# Patient Record
Sex: Male | Born: 1995 | Race: Black or African American | Hispanic: No | Marital: Single | Smoking: Never smoker
Health system: Southern US, Community
[De-identification: ages and names within clinical notes are randomized; demographics above are authoritative.]

---
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
## 2017-10-02 ENCOUNTER — Encounter (HOSPITAL_COMMUNITY): Payer: Self-pay | Admitting: Emergency Medicine

## 2017-10-02 ENCOUNTER — Other Ambulatory Visit: Payer: Self-pay

## 2017-10-02 ENCOUNTER — Emergency Department (HOSPITAL_COMMUNITY)
Admission: EM | Admit: 2017-10-02 | Discharge: 2017-10-02 | Disposition: A | Discharge status: Home / Self Care | Payer: 59 | Attending: Emergency Medicine | Admitting: Emergency Medicine

## 2017-10-02 DIAGNOSIS — K2920 Alcoholic gastritis without bleeding: Secondary | ICD-10-CM | POA: Insufficient documentation

## 2017-10-02 DIAGNOSIS — R112 Nausea with vomiting, unspecified: Secondary | ICD-10-CM

## 2017-10-02 LAB — COMPREHENSIVE METABOLIC PANEL
ALBUMIN: 4.6 g/dL (ref 3.5–5.0)
ALK PHOS: 75 U/L (ref 38–126)
ALT: 26 U/L (ref 0–44)
AST: 37 U/L (ref 15–41)
Anion gap: 13 (ref 5–15)
BUN: 15 mg/dL (ref 6–20)
CHLORIDE: 104 mmol/L (ref 98–111)
CO2: 22 mmol/L (ref 22–32)
Calcium: 9.6 mg/dL (ref 8.9–10.3)
Creatinine, Ser: 1.12 mg/dL (ref 0.61–1.24)
GFR calc Af Amer: >60 mL/min (ref >60)
GFR calc non Af Amer: >60 mL/min (ref >60)
GLUCOSE: 85 mg/dL (ref 70–99)
POTASSIUM: 4.9 mmol/L (ref 3.5–5.1)
SODIUM: 139 mmol/L (ref 135–145)
Total Bilirubin: 0.9 mg/dL (ref 0.3–1.2)
Total Protein: 7.2 g/dL (ref 6.5–8.1)

## 2017-10-02 LAB — CBC
HEMATOCRIT: 41.6 % (ref 39.0–52.0)
Hemoglobin: 13.2 g/dL (ref 13.0–17.0)
MCH: 23.3 pg — AB (ref 26.0–34.0)
MCHC: 31.7 g/dL (ref 30.0–36.0)
MCV: 73.4 fL — AB (ref 78.0–100.0)
Platelets: 174 10*3/uL (ref 150–400)
RBC: 5.67 MIL/uL (ref 4.22–5.81)
RDW: 17.2 % — ABNORMAL HIGH (ref 11.5–15.5)
WBC: 7.6 10*3/uL (ref 4.0–10.5)

## 2017-10-02 LAB — URINALYSIS, ROUTINE W REFLEX MICROSCOPIC
Bilirubin Urine: NEGATIVE
Glucose, UA: NEGATIVE mg/dL
KETONES UR: 20 mg/dL — AB
Nitrite: NEGATIVE
Protein, ur: NEGATIVE mg/dL
Specific Gravity, Urine: 1.027 (ref 1.005–1.030)
pH: 5 (ref 5.0–8.0)

## 2017-10-02 LAB — LIPASE, BLOOD: Lipase: 39 U/L (ref 11–51)

## 2017-10-02 MED ORDER — ONDANSETRON HCL 4 MG PO TABS: 4.0000 mg | ORAL_TABLET | Freq: Four times a day (QID) | ORAL | 0 refills | Status: AC

## 2017-10-02 MED ORDER — PROMETHAZINE HCL 25 MG/ML IJ SOLN
25.0000 mg | Freq: Once | INTRAMUSCULAR | Status: AC
  Administered 2017-10-02: 25 mg via INTRAMUSCULAR
  Filled 2017-10-02: qty 1

## 2017-10-02 MED ORDER — FAMOTIDINE IN NACL 20-0.9 MG/50ML-% IV SOLN
20.0000 mg | Freq: Once | INTRAVENOUS | Status: AC
  Administered 2017-10-02: 20 mg via INTRAVENOUS
  Filled 2017-10-02: qty 50

## 2017-10-02 MED ORDER — ONDANSETRON 4 MG PO TBDP
4.0000 mg | ORAL_TABLET | Freq: Once | ORAL | Status: AC | PRN
  Administered 2017-10-02: 4 mg via ORAL
  Filled 2017-10-02: qty 1

## 2017-10-02 MED ORDER — FAMOTIDINE 20 MG PO TABS: 20.0000 mg | ORAL_TABLET | Freq: Two times a day (BID) | ORAL | 0 refills | Status: AC

## 2017-10-02 MED ORDER — SODIUM CHLORIDE 0.9 % IV BOLUS
1000.0000 mL | Freq: Once | INTRAVENOUS | Status: AC
  Administered 2017-10-02: 1000 mL via INTRAVENOUS

## 2017-10-02 MED ORDER — ONDANSETRON HCL 4 MG/2ML IJ SOLN
4.0000 mg | Freq: Once | INTRAMUSCULAR | Status: DC | PRN
  Filled 2017-10-02: qty 2

## 2017-10-02 NOTE — ED Provider Notes (Signed)
[facility] EMERGENCY DEPARTMENT Provider Note   CSN: [id number] Arrival date & time: 10/02/17  1452     History   Chief Complaint Chief Complaint  Patient presents with  . Emesis    HPI Dillon Jimenez is a 22 y.o. male.  He has no significant medical history.  He is complaining of nausea and vomiting unable to keep anything down after drinking alcohol heavily last night.  He denies any other drugs and has had no recent illnesses.  He is tried to drink water but just vomited back up.  There is been no vomiting of any blood and no significant abdominal pain or diarrhea.  He denies other complaints.  The history is provided by the patient.  Emesis   This is a new problem. The current episode started 6 to 12 hours ago. The problem occurs more than 10 times per day. The problem has not changed since onset.The emesis has an appearance of stomach contents. There has been no fever. Pertinent negatives include no abdominal pain, no chills, no cough, no diarrhea, no fever and no URI.    History reviewed. No pertinent past medical history.  There are no active problems to display for this patient.   History reviewed. No pertinent surgical history.      Home Medications    Prior to Admission medications   Medication Sig Start Date End Date Taking? Authorizing Provider  hydrOXYzine (ATARAX/VISTARIL) 25 MG tablet Take 1 tablet (25 mg total) by mouth every 6 (six) hours. Prn itching 11/12/12   Linna Hoff, MD  permethrin (ELIMITE) 5 % cream Use as directed tomight , wash off in am, repeat in 1 week. 11/12/12   Linna Hoff, MD    Family History No family history on file.  Social History Social History   Tobacco Use  . Smoking status: Never Smoker  . Smokeless tobacco: Never Used  Substance Use Topics  . Alcohol use: Yes    Comment: socially  . Drug use: No     Allergies   Patient has no known allergies.   Review of Systems Review of Systems    Constitutional: Negative for chills and fever.  HENT: Negative for sore throat.   Eyes: Negative for visual disturbance.  Respiratory: Negative for cough and shortness of breath.   Cardiovascular: Negative for chest pain.  Gastrointestinal: Positive for vomiting. Negative for abdominal pain and diarrhea.  Genitourinary: Negative for dysuria and hematuria.  Musculoskeletal: Negative for back pain.  Skin: Negative for rash.  Neurological: Negative for seizures.     Physical Exam Updated Vital Signs BP 125/72 (BP Location: Right Arm)   Pulse 67   Temp 97.9 F (36.6 C) (Oral)   Resp 16   Ht 5\' 9"  (1.753 m)   Wt 65.8 kg (145 lb)   SpO2 100%   BMI 21.41 kg/m   Physical Exam  Constitutional: He appears well-developed and well-nourished.  HENT:  Head: Normocephalic and atraumatic.  Right Ear: External ear normal.  Left Ear: External ear normal.  Nose: Nose normal.  Mouth/Throat: Oropharynx is clear and moist.  Eyes: Pupils are equal, round, and reactive to light. Conjunctivae and EOM are normal.  Neck: Normal range of motion. Neck supple.  Cardiovascular: Normal rate, regular rhythm, normal heart sounds and intact distal pulses.  No murmur heard. Pulmonary/Chest: Effort normal and breath sounds normal. No respiratory distress.  Abdominal: Soft. He exhibits no mass. There is no tenderness. There is no guarding.  Musculoskeletal: Normal range of motion. He exhibits no edema.  Neurological: He is alert.  Skin: Skin is warm and dry.  Psychiatric: He has a normal mood and affect.  Nursing note and vitals reviewed.    ED Treatments / Results  Labs (all labs ordered are listed, but only abnormal results are displayed) Labs Reviewed  CBC - Abnormal; Notable for the following components:      Result Value   MCV 73.4 (*)    MCH 23.3 (*)    RDW 17.2 (*)    All other components within normal limits  URINALYSIS, ROUTINE W REFLEX MICROSCOPIC - Abnormal; Notable for the following  components:   Hgb urine dipstick SMALL (*)    Ketones, ur 20 (*)    Leukocytes, UA TRACE (*)    Bacteria, UA FEW (*)    All other components within normal limits  LIPASE, BLOOD  COMPREHENSIVE METABOLIC PANEL  ETHANOL    EKG None  Radiology No results found.  Procedures Procedures (including critical care time)  Medications Ordered in ED Medications  ondansetron (ZOFRAN) injection 4 mg (has no administration in time range)  sodium chloride 0.9 % bolus 1,000 mL (has no administration in time range)  famotidine (PEPCID) IVPB 20 mg premix (has no administration in time range)  ondansetron (ZOFRAN-ODT) disintegrating tablet 4 mg (4 mg Oral Given 10/02/17 1506)  promethazine (PHENERGAN) injection 25 mg (25 mg Intramuscular Given 10/02/17 1520)     Initial Impression / Assessment and Plan / ED Course  I have reviewed the triage vital signs and the nursing notes.  Pertinent labs & imaging results that were available during my care of the patient were reviewed by me and considered in my medical decision making (see chart for details).      Final Clinical Impressions(s) / ED Diagnoses   Final diagnoses:  Non-intractable vomiting with nausea, unspecified vomiting type  Acute alcoholic gastritis, presence of bleeding unspecified    ED Discharge Orders        Ordered    famotidine (PEPCID) 20 MG tablet  2 times daily     10/02/17 1844    ondansetron (ZOFRAN) 4 MG tablet  Every 6 hours     10/02/17 1844       Terrilee FilesButler, Chamaine Stankus C, MD 10/03/17 [phone]

## 2017-10-02 NOTE — ED Notes (Signed)
Pt given [place] sandwich, PB and crackers per MD

## 2017-10-02 NOTE — ED Triage Notes (Signed)
Patient to ED c/o N/V since this morning - states he drank a lot yesterday and didn't have anything on his stomach. Patient actively vomiting in triage. Reports he can't keep anything down, including water. Denies pain.

## 2017-10-02 NOTE — ED Notes (Signed)
Pt says he woke up with scratches on his neck, does not know where they came from; c/o of burning sensation; scratches noted to R neck and shoulder

## 2017-10-02 NOTE — ED Notes (Signed)
Pt tolerating fluids well. 

## 2017-10-02 NOTE — ED Provider Notes (Signed)
Patient placed in Quick Look pathway, seen and evaluated   Chief Complaint: emesis  HPI:   Dillon Jimenez is a. 22 y.o. male who presents to the ED with n/v that started this morning. Patient states he drank a lot yesterday and didn't have anything on his stomach. Patient actively vomiting in triage. Reports he can't keep anything down, including water. Denies pain  ROS: GI; n/v  Physical Exam:  BP 125/72 (BP Location: Right Arm)   Pulse 67   Temp 97.9 F (36.6 C) (Oral)   Resp 16   Ht 5\' 9"  (1.753 m)   Wt 65.8 kg (145 lb)   SpO2 100%   BMI 21.41 kg/m    Gen: No distress  Neuro: Awake and Alert  Skin: Warm and dry  GI: actively vomiting in the exam room    Initiation of care has begun. The patient has been counseled on the process, plan, and necessity for staying for the completion/evaluation, and the remainder of the medical screening examination    Janne Napoleoneese, Hope M, NP 10/02/17 1512    Blane OharaZavitz, Joshua, MD 10/03/17 [phone]

## 2017-10-02 NOTE — Discharge Instructions (Addendum)
Your evaluated in the emergency department for nausea vomiting and upper abdominal pain in the setting of having drank alcohol last night.  You improved after fluids and we are sending her home with some acid medicine and nausea medicine.  Please return if any concerns.

## 2019-06-21 ENCOUNTER — Encounter (HOSPITAL_COMMUNITY): Payer: Self-pay | Admitting: Radiology

## 2021-01-12 ENCOUNTER — Emergency Department (HOSPITAL_COMMUNITY)
Admission: EM | Admit: 2021-01-12 | Discharge: 2021-01-13 | Disposition: A | Discharge status: Home / Self Care | Payer: Self-pay | Attending: Emergency Medicine | Admitting: Emergency Medicine

## 2021-01-12 ENCOUNTER — Other Ambulatory Visit: Payer: Self-pay

## 2021-01-12 ENCOUNTER — Encounter (HOSPITAL_COMMUNITY): Payer: Self-pay | Admitting: Emergency Medicine

## 2021-01-12 DIAGNOSIS — R112 Nausea with vomiting, unspecified: Secondary | ICD-10-CM | POA: Insufficient documentation

## 2021-01-12 DIAGNOSIS — R1013 Epigastric pain: Secondary | ICD-10-CM | POA: Insufficient documentation

## 2021-01-12 LAB — URINALYSIS, ROUTINE W REFLEX MICROSCOPIC
Bacteria, UA: NONE SEEN
Bilirubin Urine: NEGATIVE
Glucose, UA: NEGATIVE mg/dL
Hgb urine dipstick: NEGATIVE
Ketones, ur: 80 mg/dL — AB
Leukocytes,Ua: NEGATIVE
Nitrite: NEGATIVE
Protein, ur: 30 mg/dL — AB
Specific Gravity, Urine: 1.028 (ref 1.005–1.030)
pH: 5 (ref 5.0–8.0)

## 2021-01-12 LAB — COMPREHENSIVE METABOLIC PANEL
ALT: 40 U/L (ref 0–44)
AST: 53 U/L — ABNORMAL HIGH (ref 15–41)
Albumin: 4.7 g/dL (ref 3.5–5.0)
Alkaline Phosphatase: 78 U/L (ref 38–126)
Anion gap: 14 (ref 5–15)
BUN: 17 mg/dL (ref 6–20)
CO2: 21 mmol/L — ABNORMAL LOW (ref 22–32)
Calcium: 9.2 mg/dL (ref 8.9–10.3)
Chloride: 101 mmol/L (ref 98–111)
Creatinine, Ser: 0.98 mg/dL (ref 0.61–1.24)
GFR, Estimated: >60 mL/min (ref >60)
Glucose, Bld: 79 mg/dL (ref 70–99)
Potassium: 4.5 mmol/L (ref 3.5–5.1)
Sodium: 136 mmol/L (ref 135–145)
Total Bilirubin: 0.6 mg/dL (ref 0.3–1.2)
Total Protein: 7.8 g/dL (ref 6.5–8.1)

## 2021-01-12 LAB — CBC WITH DIFFERENTIAL/PLATELET
Abs Immature Granulocytes: 0.02 10*3/uL (ref 0.00–0.07)
Basophils Absolute: 0 10*3/uL (ref 0.0–0.1)
Basophils Relative: 0 %
Eosinophils Absolute: 0 10*3/uL (ref 0.0–0.5)
Eosinophils Relative: 0 %
HCT: 43.3 % (ref 39.0–52.0)
Hemoglobin: 13.8 g/dL (ref 13.0–17.0)
Immature Granulocytes: 0 %
Lymphocytes Relative: 13 %
Lymphs Abs: 0.8 10*3/uL (ref 0.7–4.0)
MCH: 24.1 pg — ABNORMAL LOW (ref 26.0–34.0)
MCHC: 31.9 g/dL (ref 30.0–36.0)
MCV: 75.6 fL — ABNORMAL LOW (ref 80.0–100.0)
Monocytes Absolute: 0.2 10*3/uL (ref 0.1–1.0)
Monocytes Relative: 4 %
Neutro Abs: 5.5 10*3/uL (ref 1.7–7.7)
Neutrophils Relative %: 83 %
Platelets: 185 10*3/uL (ref 150–400)
RBC: 5.73 MIL/uL (ref 4.22–5.81)
RDW: 16.7 % — ABNORMAL HIGH (ref 11.5–15.5)
WBC: 6.6 10*3/uL (ref 4.0–10.5)
nRBC: 0 % (ref 0.0–0.2)

## 2021-01-12 LAB — LIPASE, BLOOD: Lipase: 35 U/L (ref 11–51)

## 2021-01-12 MED ORDER — ONDANSETRON 4 MG PO TBDP
8.0000 mg | ORAL_TABLET | Freq: Once | ORAL | Status: AC
  Administered 2021-01-13: 8 mg via ORAL
  Filled 2021-01-12: qty 2

## 2021-01-12 NOTE — ED Triage Notes (Signed)
Pt reports vomiting since this AM.  RLQ pain started 10 min ago.  Also reports "a little" diarrhea.

## 2021-01-12 NOTE — ED Provider Notes (Signed)
Emergency Medicine Provider Triage Evaluation Note  Dillon Jimenez , a 25 y.o. male  was evaluated in triage.  Pt complains of nausea, vomiting, abdominal pain.  Started acutely this morning, has not tried any alleviating factors.  No history of prior abdominal surgeries, no urinary symptoms.  Nobody around him is sick, patient smokes marijuana every other day..  Review of Systems  Positive: Nausea, vomiting, abdominal pain Negative: Fevers  Physical Exam  BP 120/62 (BP Location: Right Arm)   Pulse 73   Temp 97.8 F (36.6 C) (Oral)   Resp 16   SpO2 100%  Gen:   Awake, no distress   Resp:  Normal effort  MSK:   Moves extremities without difficulty  Other:  Right lower quadrant abdominal tenderness  Medical Decision Making  Medically screening exam initiated at 3:19 PM.  Appropriate orders placed.  Dillon Jimenez was informed that the remainder of the evaluation will be completed by another provider, this initial triage assessment does not replace that evaluation, and the importance of remaining in the ED until their evaluation is complete.  Abdominal pain, will start with labs and treat with nausea medicine   Theron Arista, PA-C 01/12/21 1520    Benjiman Core, MD 01/12/21 1652

## 2021-01-13 MED ORDER — ONDANSETRON 4 MG PO TBDP: 4.0000 mg | ORAL_TABLET | Freq: Three times a day (TID) | ORAL | 0 refills | Status: AC | PRN

## 2021-01-13 NOTE — ED Provider Notes (Signed)
[facility] EMERGENCY DEPARTMENT Provider Note  CSN: [id number] Arrival date & time: 01/12/21 1417  Chief Complaint(s) Abdominal Pain and Vomiting  HPI Dillon Jimenez is a 25 y.o. male here for 1 day of upper abdominal pain with associated nausea and nonbloody nonbilious emesis.  He reports drinking heavily last night.  States that he drank lots of tequila.  Reports that he has been unable to keep much down today.  Epigastric abdominal pain worse with emesis.  Currently denies any abdominal pain at this time.  He denies any known sick contacts.  No fevers or chills.  No coughing or congestion.  HPI  Past Medical History History reviewed. No pertinent past medical history. There are no problems to display for this patient.  Home Medication(s) Prior to Admission medications   Medication Sig Start Date End Date Taking? Authorizing Provider  ondansetron (ZOFRAN ODT) 4 MG disintegrating tablet Take 1 tablet (4 mg total) by mouth every 8 (eight) hours as needed for up to 3 days for nausea or vomiting. 01/13/21 01/16/21 Yes Malvika Tung, Amadeo Garnet, MD  famotidine (PEPCID) 20 MG tablet Take 1 tablet (20 mg total) by mouth 2 (two) times daily. 10/02/17   Terrilee Files, MD  hydrOXYzine (ATARAX/VISTARIL) 25 MG tablet Take 1 tablet (25 mg total) by mouth every 6 (six) hours. Prn itching Patient not taking: Reported on 10/02/2017 11/12/12   Linna Hoff, MD  ondansetron (ZOFRAN) 4 MG tablet Take 1 tablet (4 mg total) by mouth every 6 (six) hours. 10/02/17   Terrilee Files, MD  permethrin (ELIMITE) 5 % cream Use as directed tomight , wash off in am, repeat in 1 week. Patient not taking: Reported on 10/02/2017 11/12/12   Linna Hoff, MD                                                                                                                                    Past Surgical History History reviewed. No pertinent surgical history. Family History No family history on  file.  Social History Social History   Tobacco Use   Smoking status: Never   Smokeless tobacco: Never  Substance Use Topics   Alcohol use: Yes    Comment: socially   Drug use: No   Allergies Patient has no known allergies.  Review of Systems Review of Systems All other systems are reviewed and are negative for acute change except as noted in the HPI  Physical Exam Vital Signs  I have reviewed the triage vital signs BP 108/68 (BP Location: Right Arm)   Pulse 83   Temp 97.7 F (36.5 C) (Oral)   Resp 16   Ht 5\' 9"  (1.753 m)   Wt 64.4 kg   SpO2 100%   BMI 20.97 kg/m   Physical Exam Vitals reviewed.  Constitutional:      General: He is not in acute distress.    Appearance: He  is well-developed. He is not diaphoretic.  HENT:     Head: Normocephalic and atraumatic.     Right Ear: External ear normal.     Left Ear: External ear normal.     Nose: Nose normal.     Mouth/Throat:     Mouth: Mucous membranes are moist.  Eyes:     General: No scleral icterus.    Conjunctiva/sclera: Conjunctivae normal.  Neck:     Trachea: Phonation normal.  Cardiovascular:     Rate and Rhythm: Normal rate and regular rhythm.  Pulmonary:     Effort: Pulmonary effort is normal. No respiratory distress.     Breath sounds: No stridor.  Abdominal:     General: There is no distension.     Tenderness: There is no abdominal tenderness.  Musculoskeletal:        General: Normal range of motion.     Cervical back: Normal range of motion.  Neurological:     Mental Status: He is alert and oriented to person, place, and time.  Psychiatric:        Behavior: Behavior normal.    ED Results and Treatments Labs (all labs ordered are listed, but only abnormal results are displayed) Labs Reviewed  CBC WITH DIFFERENTIAL/PLATELET - Abnormal; Notable for the following components:      Result Value   MCV 75.6 (*)    MCH 24.1 (*)    RDW 16.7 (*)    All other components within normal limits   COMPREHENSIVE METABOLIC PANEL - Abnormal; Notable for the following components:   CO2 21 (*)    AST 53 (*)    All other components within normal limits  URINALYSIS, ROUTINE W REFLEX MICROSCOPIC - Abnormal; Notable for the following components:   APPearance HAZY (*)    Ketones, ur 80 (*)    Protein, ur 30 (*)    All other components within normal limits  LIPASE, BLOOD                                                                                                                         EKG  EKG Interpretation  Date/Time:    Ventricular Rate:    PR Interval:    QRS Duration:   QT Interval:    QTC Calculation:   R Axis:     Text Interpretation:         Radiology No results found.  Pertinent labs & imaging results that were available during my care of the patient were reviewed by me and considered in my medical decision making (see MDM for details).  Medications Ordered in ED Medications  ondansetron (ZOFRAN-ODT) disintegrating tablet 8 mg (8 mg Oral Given 01/13/21 0241)  Procedures Procedures  (including critical care time)  Medical Decision Making / ED Course I have reviewed the nursing notes for this encounter and the patient's prior records (if available in EHR or on provided paperwork).  Dillon Jimenez was evaluated in Emergency Department on 01/13/2021 for the symptoms described in the history of present illness. He was evaluated in the context of the global COVID-19 pandemic, which necessitated consideration that the patient might be at risk for infection with the SARS-CoV-2 virus that causes COVID-19. Institutional protocols and algorithms that pertain to the evaluation of patients at risk for COVID-19 are in a state of rapid change based on information released by regulatory bodies including the CDC and federal and state organizations.  These policies and algorithms were followed during the patient's care in the ED.     Patient presents with nausea and vomiting. Reported abdominal pain with emesis.. Abdomen benign on my exam.  Patient seen in the MSE process and appropriate labs obtained.  Pertinent labs & imaging results that were available during my care of the patient were reviewed by me and considered in my medical decision making:  Grossly reassuring without leukocytosis or anemia.  No significant electrolyte derangements or renal sufficiency.  No evidence of bili obstruction or pancreatitis.  Low suspicion for serious intra-abdominal inflammatory/infectious process or bowel obstruction..  Patient tolerating oral intake in the emergency department.  Final Clinical Impression(s) / ED Diagnoses Final diagnoses:  Nausea and vomiting in adult    The patient appears reasonably screened and/or stabilized for discharge and I doubt any other medical condition or other [facility] requiring further screening, evaluation, or treatment in the ED at this time prior to discharge. Safe for discharge with strict return precautions.  Disposition: Discharge  Condition: Good  I have discussed the results, Dx and Tx plan with the patient/family who expressed understanding and agree(s) with the plan. Discharge instructions discussed at length. The patient/family was given strict return precautions who verbalized understanding of the instructions. No further questions at time of discharge.    ED Discharge Orders          Ordered    ondansetron (ZOFRAN ODT) 4 MG disintegrating tablet  Every 8 hours PRN        01/13/21 0246             Follow Up: Primary care provider  Call  as needed   This chart was dictated using voice recognition software.  Despite best efforts to proofread,  errors can occur which can change the documentation meaning.    Nira Conn, MD 01/13/21 [phone]

## 2021-01-13 NOTE — ED Notes (Signed)
Pt previously received po challenge

## 2021-01-13 NOTE — ED Notes (Signed)
Provider at bedside

## 2021-09-29 IMAGING — CT CT HEAD W/O CM
5 series · 17 of 47 positions shown, 19 images · non-contrast
Comparison: None.

CLINICAL DATA: Initial evaluation for acute encephalopathy.

EXAM:
CT HEAD WITHOUT CONTRAST
TECHNIQUE: Contiguous axial images were obtained from the base of the skull
through the vertex without intravenous contrast.

[Series 3: head wo · axial · 0.45mm/px · z∈[-78,+32]mm · 5 of 34 slices shown, 7 images (1 of 2)]
[im 6/34  brain]
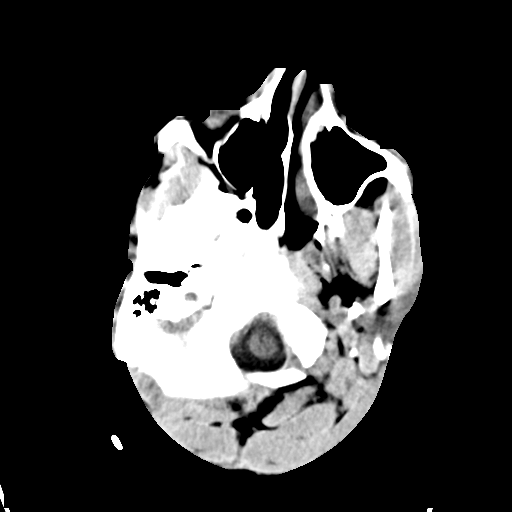
[im 6/34  bone]
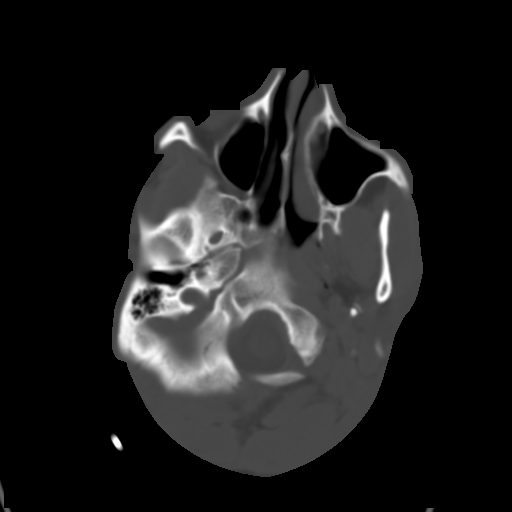
[im 12/34  brain]
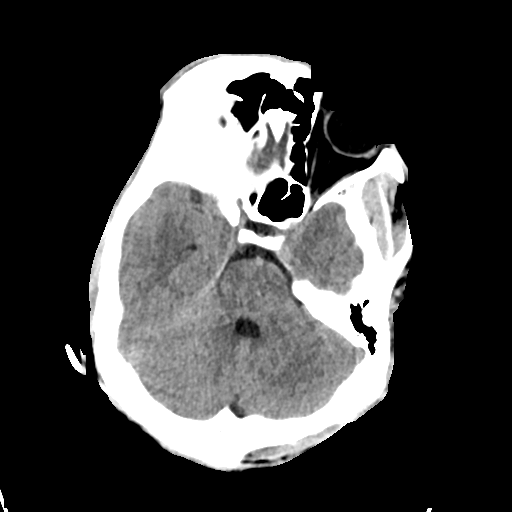
[im 17/34  brain]
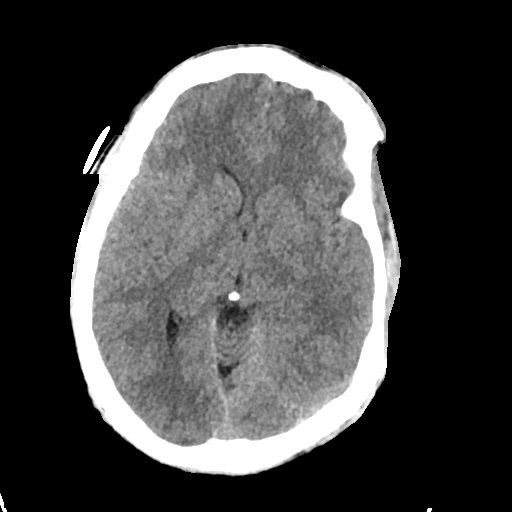
[im 23/34  brain]
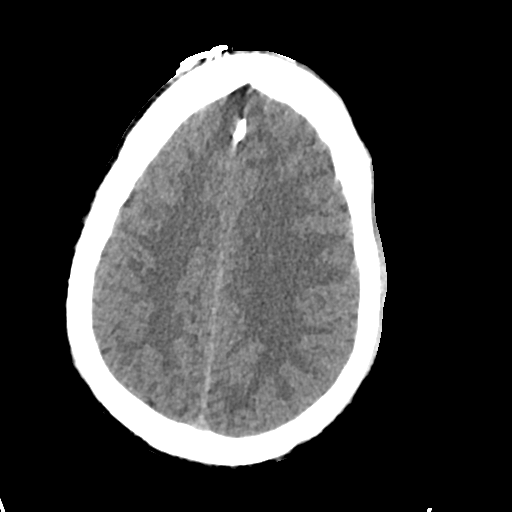
[im 28/34  brain]
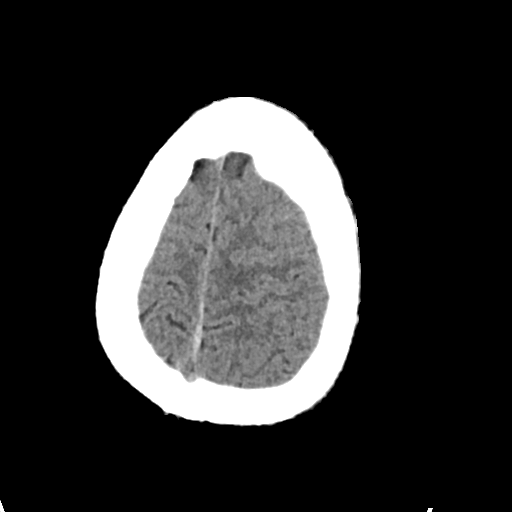
[im 28/34  bone]
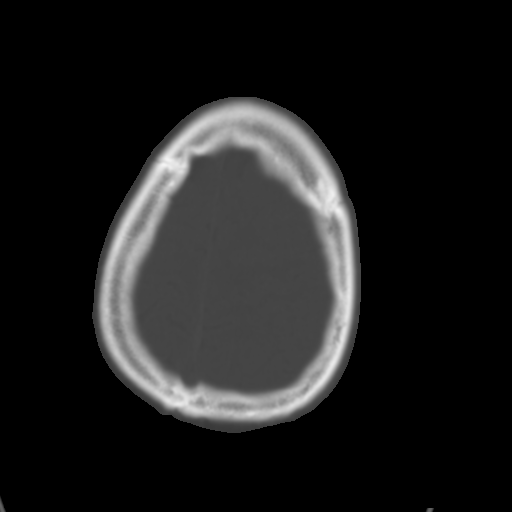

[Series 4: head bone · axial · 0.45mm/px · z∈[-86,-30]mm · 4 of 85 slices shown]
[im 10/85  bone]
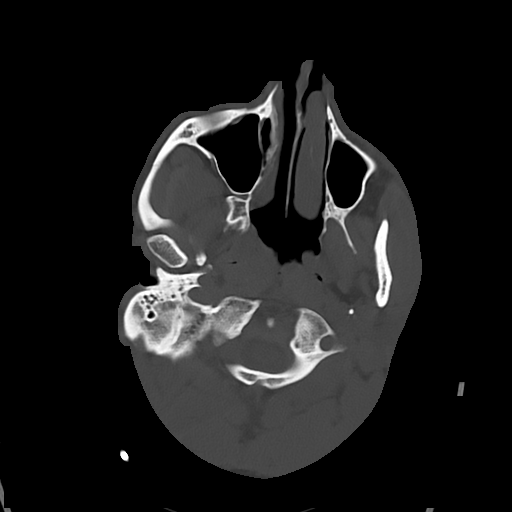
[im 19/85  bone]
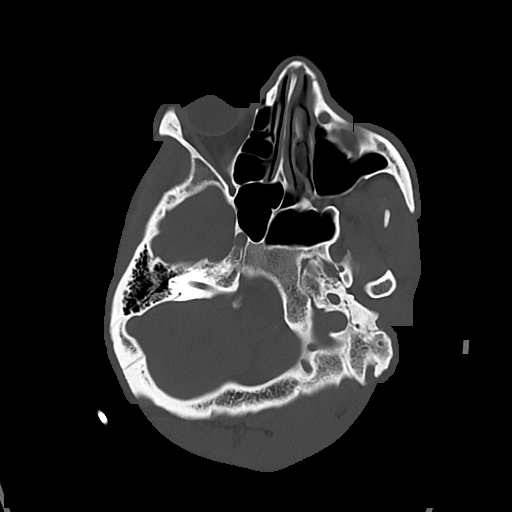
[im 29/85  bone]
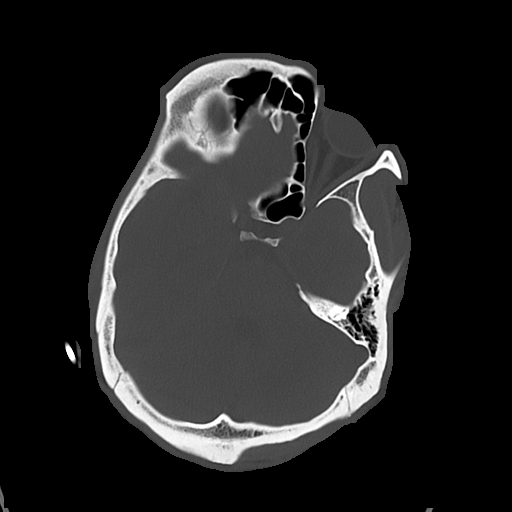
[im 38/85  bone]
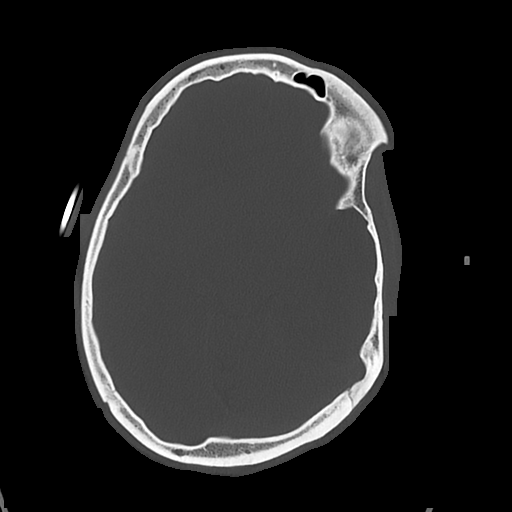

[Series 5: cor soft · coronal · 0.34mm/px · 3 of 70 slices shown]
[im 24/70  brain]
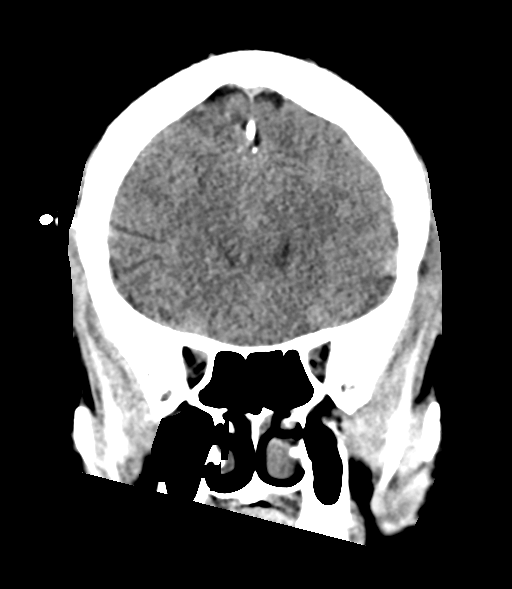
[im 31/70  brain]
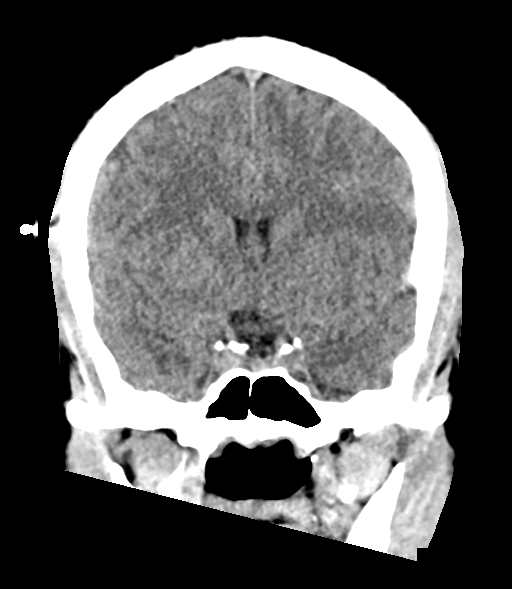
[im 39/70  brain]
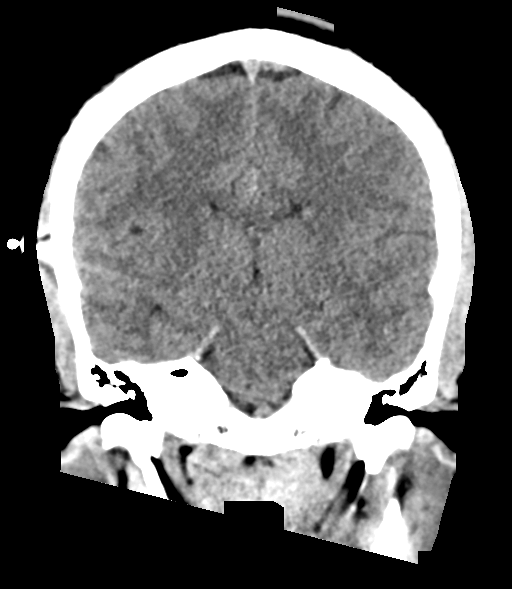

[Series 6: sag soft · sagittal · 0.40mm/px · 3 of 57 slices shown]
[im 24/57  brain]
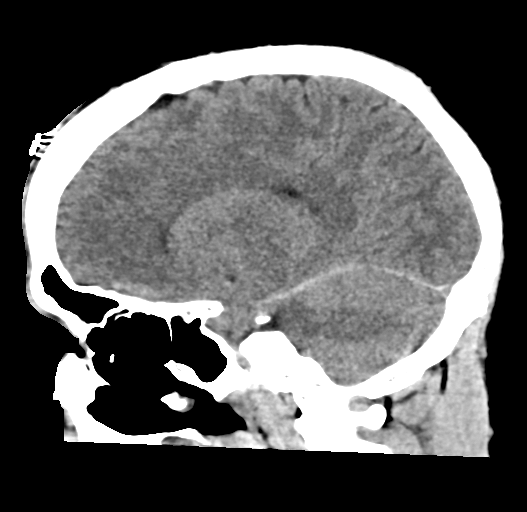
[im 28/57  brain]
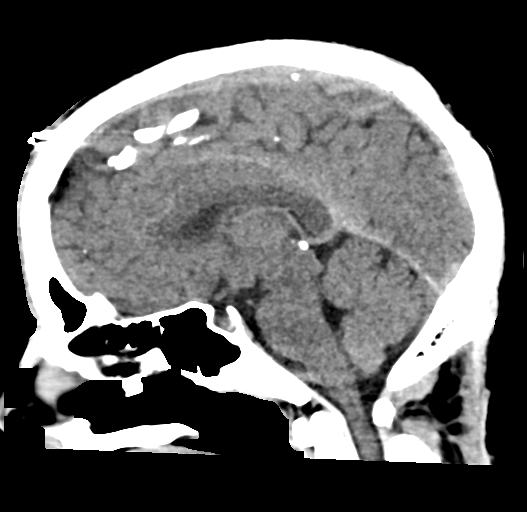
[im 32/57  brain]
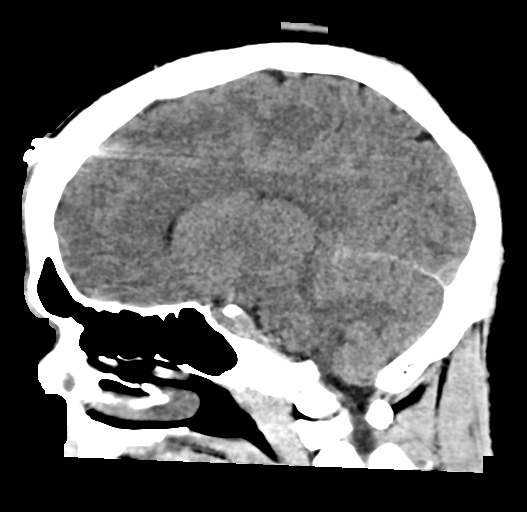

[Series 7: head wo · axial · 0.45mm/px · z∈[-44,-19]mm · 2 of 16 slices shown (2 of 2)]
[im 6/16  brain]
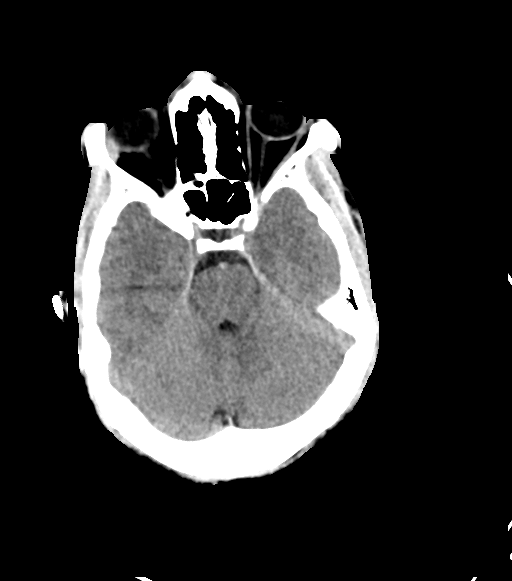
[im 11/16  brain]
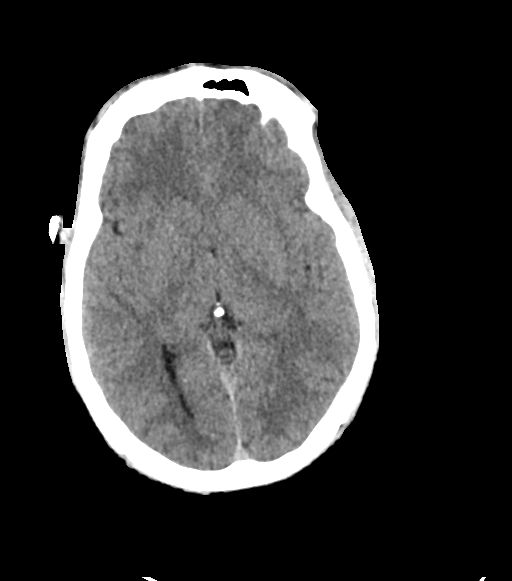

[17 of 47 positions shown; findings below may reference images not displayed]

FINDINGS: Brain: Examination limited by patient positioning.

Cerebral volume within normal limits. No acute intracranial
hemorrhage. No acute large vessel territory infarct. No mass lesion,
midline shift or mass effect. No hydrocephalus. No extra-axial fluid
collection.

Vascular: No hyperdense vessel.

Skull: Scalp soft tissues and calvarium within normal limits.

Sinuses/Orbits: Globes and orbital soft tissues are normal.
Paranasal sinuses mastoid air cells are clear.

Other: None.
IMPRESSION: Normal head CT. No acute intracranial abnormality identified.
